# Patient Record
Sex: Male | Born: 1970 | Race: White | Hispanic: No | Marital: Married | Smoking: Never smoker
Health system: Southern US, Community
[De-identification: ages and names within clinical notes are randomized; demographics above are authoritative.]

## PROBLEM LIST (undated history)

## (undated) DIAGNOSIS — C801 Malignant (primary) neoplasm, unspecified: Secondary | ICD-10-CM

## (undated) HISTORY — PX: BACK SURGERY: SHX140

---
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
## 1997-12-09 DIAGNOSIS — C801 Malignant (primary) neoplasm, unspecified: Secondary | ICD-10-CM

## 1997-12-09 HISTORY — DX: Malignant (primary) neoplasm, unspecified: C80.1

## 2005-12-09 HISTORY — PX: CARPAL TUNNEL RELEASE: SHX101

## 2017-07-09 ENCOUNTER — Encounter
Admission: RE | Admit: 2017-07-09 | Discharge: 2017-07-09 | Disposition: A | Discharge status: Home / Self Care | Payer: BLUE CROSS/BLUE SHIELD | Source: Ambulatory Visit | Attending: Neurosurgery | Admitting: Neurosurgery

## 2017-07-09 ENCOUNTER — Ambulatory Visit
Admission: RE | Admit: 2017-07-09 | Discharge: 2017-07-09 | Disposition: A | Discharge status: Home / Self Care | Payer: BLUE CROSS/BLUE SHIELD | Source: Ambulatory Visit | Attending: Neurosurgery | Admitting: Neurosurgery

## 2017-07-09 DIAGNOSIS — Z01812 Encounter for preprocedural laboratory examination: Secondary | ICD-10-CM | POA: Insufficient documentation

## 2017-07-09 DIAGNOSIS — M5416 Radiculopathy, lumbar region: Secondary | ICD-10-CM | POA: Insufficient documentation

## 2017-07-09 DIAGNOSIS — Z01818 Encounter for other preprocedural examination: Secondary | ICD-10-CM | POA: Insufficient documentation

## 2017-07-09 DIAGNOSIS — Z01810 Encounter for preprocedural cardiovascular examination: Secondary | ICD-10-CM | POA: Diagnosis present

## 2017-07-09 HISTORY — DX: Malignant (primary) neoplasm, unspecified: C80.1

## 2017-07-09 LAB — CBC
HCT: 43 % (ref 40.0–52.0)
HEMOGLOBIN: 15 g/dL (ref 13.0–18.0)
MCH: 30.8 pg (ref 26.0–34.0)
MCHC: 34.9 g/dL (ref 32.0–36.0)
MCV: 88.1 fL (ref 80.0–100.0)
Platelets: 254 10*3/uL (ref 150–440)
RBC: 4.87 MIL/uL (ref 4.40–5.90)
RDW: 13 % (ref 11.5–14.5)
WBC: 5 10*3/uL (ref 3.8–10.6)

## 2017-07-09 LAB — TYPE AND SCREEN
ABO/RH(D): B POS
ANTIBODY SCREEN: NEGATIVE

## 2017-07-09 LAB — BASIC METABOLIC PANEL
ANION GAP: 8 (ref 5–15)
BUN: 22 mg/dL — ABNORMAL HIGH (ref 6–20)
CALCIUM: 9.5 mg/dL (ref 8.9–10.3)
CO2: 27 mmol/L (ref 22–32)
CREATININE: 0.9 mg/dL (ref 0.61–1.24)
Chloride: 106 mmol/L (ref 101–111)
GFR calc non Af Amer: >60 mL/min (ref >60)
Glucose, Bld: 94 mg/dL (ref 65–99)
Potassium: 4.1 mmol/L (ref 3.5–5.1)
SODIUM: 141 mmol/L (ref 135–145)

## 2017-07-09 LAB — SURGICAL PCR SCREEN
MRSA, PCR: NEGATIVE
STAPHYLOCOCCUS AUREUS: NEGATIVE

## 2017-07-09 LAB — URINALYSIS, ROUTINE W REFLEX MICROSCOPIC
Bilirubin Urine: NEGATIVE
GLUCOSE, UA: NEGATIVE mg/dL
Hgb urine dipstick: NEGATIVE
KETONES UR: NEGATIVE mg/dL
LEUKOCYTES UA: NEGATIVE
Nitrite: NEGATIVE
PROTEIN: NEGATIVE mg/dL
Specific Gravity, Urine: 1.02 (ref 1.005–1.030)
pH: 6 (ref 5.0–8.0)

## 2017-07-09 LAB — PROTIME-INR
INR: 0.92
Prothrombin Time: 12.4 seconds (ref 11.4–15.2)

## 2017-07-09 LAB — APTT: APTT: 28 s (ref 24–36)

## 2017-07-09 NOTE — Patient Instructions (Signed)
  Your procedure is scheduled on:Wednesday August 8 , 2018. Report to Same Day Surgery. To find out your arrival time please call [phone] between 1PM - 3PM on Tuesday July 15, 2017 .  Remember: Instructions that are not followed completely may result in serious medical risk, up to and including death, or upon the discretion of your surgeon and anesthesiologist your surgery may need to be rescheduled.    _x___ 1. Do not eat food or drink liquids after midnight. No gum chewing or hard candies.     __x__ 2. No Alcohol for 24 hours before or after surgery.   ____ 3. Bring all medications with you on the day of surgery if instructed.    __x__ 4. Notify your doctor if there is any change in your medical condition     (cold, fever, infections).    _____ 5. No smoking 24 hours prior to surgery.     Do not wear jewelry, make-up, hairpins, clips or nail polish.  Do not wear lotions, powders, or perfumes.   Do not shave 48 hours prior to surgery. Men may shave face and neck.  Do not bring valuables to the hospital.    [facility] is not responsible for any belongings or valuables.               Contacts, dentures or bridgework may not be worn into surgery.  Leave your suitcase in the car. After surgery it may be brought to your room.  For patients admitted to the hospital, discharge time is determined by your treatment team.   Patients discharged the day of surgery will not be allowed to drive home.    Please read over the following fact sheets that you were given:   [facility] Preparing for Surgery  ____ Take these medicines the morning of surgery with A SIP OF WATER: NONE     ____ Fleet Enema (as directed)   __x__ Use CHG Soap as directed on instruction sheet  ____ Use inhalers on the day of surgery and bring to hospital day of surgery  ____ Stop metformin 2 days prior to surgery    ____ Take 1/2 of usual insulin dose the night before surgery and none on the morning of  surgery.   __x__ Stopped aspirin already.  ____ Stop Anti-inflammatories such as Advil, Aleve, Ibuprofen, Motrin, Naproxen,  Naprosyn, Goodies powders or aspirin  products. OK to take Tylenol.   __x__ Stop supplements:Ascorbic Acid (VITAMIN C)  until after surgery.    ____ Bring C-Pap to the hospital.

## 2017-07-23 ENCOUNTER — Encounter
Admission: RE | Disposition: A | Discharge status: Home / Self Care | Payer: Self-pay | Source: Ambulatory Visit | Attending: Neurosurgery

## 2017-07-23 ENCOUNTER — Ambulatory Visit: Payer: BLUE CROSS/BLUE SHIELD

## 2017-07-23 ENCOUNTER — Ambulatory Visit
Admission: RE | Admit: 2017-07-23 | Discharge: 2017-07-23 | Disposition: A | Discharge status: Home / Self Care | Payer: BLUE CROSS/BLUE SHIELD | Source: Ambulatory Visit | Attending: Neurosurgery | Admitting: Neurosurgery

## 2017-07-23 ENCOUNTER — Encounter: Payer: Self-pay | Admitting: *Deleted

## 2017-07-23 ENCOUNTER — Ambulatory Visit: Payer: BLUE CROSS/BLUE SHIELD | Admitting: Certified Registered"

## 2017-07-23 DIAGNOSIS — M4807 Spinal stenosis, lumbosacral region: Secondary | ICD-10-CM | POA: Diagnosis not present

## 2017-07-23 DIAGNOSIS — Z79899 Other long term (current) drug therapy: Secondary | ICD-10-CM | POA: Diagnosis not present

## 2017-07-23 DIAGNOSIS — M48062 Spinal stenosis, lumbar region with neurogenic claudication: Secondary | ICD-10-CM | POA: Diagnosis present

## 2017-07-23 DIAGNOSIS — Z791 Long term (current) use of non-steroidal anti-inflammatories (NSAID): Secondary | ICD-10-CM | POA: Insufficient documentation

## 2017-07-23 DIAGNOSIS — Z7982 Long term (current) use of aspirin: Secondary | ICD-10-CM | POA: Insufficient documentation

## 2017-07-23 DIAGNOSIS — Z419 Encounter for procedure for purposes other than remedying health state, unspecified: Secondary | ICD-10-CM

## 2017-07-23 DIAGNOSIS — M5416 Radiculopathy, lumbar region: Secondary | ICD-10-CM | POA: Insufficient documentation

## 2017-07-23 HISTORY — PX: LUMBAR LAMINECTOMY/DECOMPRESSION MICRODISCECTOMY: SHX5026

## 2017-07-23 LAB — ABO/RH: ABO/RH(D): B POS

## 2017-07-23 SURGERY — LUMBAR LAMINECTOMY/DECOMPRESSION MICRODISCECTOMY 2 LEVELS
Anesthesia: General | Site: Spine Lumbar | Wound class: Clean

## 2017-07-23 MED ORDER — CEFAZOLIN SODIUM-DEXTROSE 2-4 GM/100ML-% IV SOLN
2.0000 g | INTRAVENOUS | Status: AC
  Administered 2017-07-23: 2 g via INTRAVENOUS

## 2017-07-23 MED ORDER — PHENYLEPHRINE HCL 10 MG/ML IJ SOLN
INTRAMUSCULAR | Status: AC
  Filled 2017-07-23: qty 1

## 2017-07-23 MED ORDER — MIDAZOLAM HCL 2 MG/2ML IJ SOLN
INTRAMUSCULAR | Status: AC
  Filled 2017-07-23: qty 2

## 2017-07-23 MED ORDER — THROMBIN 5000 UNITS EX SOLR
CUTANEOUS | Status: AC
Start: 2017-07-23 — End: ?
  Filled 2017-07-23: qty 5000

## 2017-07-23 MED ORDER — ROCURONIUM BROMIDE 100 MG/10ML IV SOLN
INTRAVENOUS | Status: DC | PRN
  Administered 2017-07-23: 50 mg via INTRAVENOUS

## 2017-07-23 MED ORDER — OXYCODONE HCL 5 MG PO TABS: 5.0000 mg | ORAL_TABLET | ORAL | 0 refills | Status: AC | PRN

## 2017-07-23 MED ORDER — CELECOXIB 200 MG PO CAPS: 200.0000 mg | ORAL_CAPSULE | Freq: Every day | ORAL | 0 refills | Status: AC

## 2017-07-23 MED ORDER — PROPOFOL 10 MG/ML IV BOLUS
INTRAVENOUS | Status: DC | PRN
Start: 2017-07-23 — End: 2017-07-23
  Administered 2017-07-23: 180 mg via INTRAVENOUS

## 2017-07-23 MED ORDER — DEXAMETHASONE SODIUM PHOSPHATE 10 MG/ML IJ SOLN
INTRAMUSCULAR | Status: AC
  Filled 2017-07-23: qty 1

## 2017-07-23 MED ORDER — BUPIVACAINE LIPOSOME 1.3 % IJ SUSP
INTRAMUSCULAR | Status: AC
  Filled 2017-07-23: qty 20

## 2017-07-23 MED ORDER — METHOCARBAMOL 500 MG PO TABS: 500.0000 mg | ORAL_TABLET | Freq: Four times a day (QID) | ORAL | 0 refills | Status: AC | PRN

## 2017-07-23 MED ORDER — DEXAMETHASONE SODIUM PHOSPHATE 10 MG/ML IJ SOLN
INTRAMUSCULAR | Status: DC | PRN
  Administered 2017-07-23: 10 mg via INTRAVENOUS

## 2017-07-23 MED ORDER — BUPIVACAINE-EPINEPHRINE (PF) 0.5% -1:200000 IJ SOLN
INTRAMUSCULAR | Status: AC
  Filled 2017-07-23: qty 30

## 2017-07-23 MED ORDER — REMIFENTANIL HCL 1 MG IV SOLR
INTRAVENOUS | Status: AC
  Filled 2017-07-23: qty 1000

## 2017-07-23 MED ORDER — PROMETHAZINE HCL 25 MG/ML IJ SOLN
12.5000 mg | Freq: Once | INTRAMUSCULAR | Status: AC
  Administered 2017-07-23: 12.5 mg via INTRAVENOUS

## 2017-07-23 MED ORDER — FENTANYL CITRATE (PF) 100 MCG/2ML IJ SOLN
INTRAMUSCULAR | Status: DC | PRN
Start: 2017-07-23 — End: 2017-07-23
  Administered 2017-07-23: 100 ug via INTRAVENOUS

## 2017-07-23 MED ORDER — SODIUM CHLORIDE FLUSH 0.9 % IV SOLN
INTRAVENOUS | Status: AC
Start: 2017-07-23 — End: ?
  Filled 2017-07-23: qty 10

## 2017-07-23 MED ORDER — ONDANSETRON HCL 4 MG/2ML IJ SOLN
4.0000 mg | Freq: Once | INTRAMUSCULAR | Status: AC | PRN
  Administered 2017-07-23: 4 mg via INTRAVENOUS

## 2017-07-23 MED ORDER — GELATIN ABSORBABLE 100 CM EX MISC
CUTANEOUS | Status: AC
  Filled 2017-07-23: qty 1

## 2017-07-23 MED ORDER — METHYLPREDNISOLONE ACETATE 40 MG/ML IJ SUSP
INTRAMUSCULAR | Status: AC
  Filled 2017-07-23: qty 1

## 2017-07-23 MED ORDER — PROPOFOL 10 MG/ML IV BOLUS
INTRAVENOUS | Status: AC
  Filled 2017-07-23: qty 40

## 2017-07-23 MED ORDER — LACTATED RINGERS IV SOLN
INTRAVENOUS | Status: DC
  Administered 2017-07-23 (×2): via INTRAVENOUS

## 2017-07-23 MED ORDER — CEFAZOLIN SODIUM-DEXTROSE 2-4 GM/100ML-% IV SOLN
INTRAVENOUS | Status: AC
  Filled 2017-07-23: qty 100

## 2017-07-23 MED ORDER — EPHEDRINE SULFATE 50 MG/ML IJ SOLN
INTRAMUSCULAR | Status: DC | PRN
  Administered 2017-07-23: 10 mg via INTRAVENOUS

## 2017-07-23 MED ORDER — BUPIVACAINE-EPINEPHRINE (PF) 0.5% -1:200000 IJ SOLN
INTRAMUSCULAR | Status: DC | PRN
  Administered 2017-07-23: 10 mL

## 2017-07-23 MED ORDER — SODIUM CHLORIDE FLUSH 0.9 % IV SOLN
INTRAVENOUS | Status: AC
  Filled 2017-07-23: qty 10

## 2017-07-23 MED ORDER — ONDANSETRON HCL 4 MG/2ML IJ SOLN
INTRAMUSCULAR | Status: DC | PRN
  Administered 2017-07-23: 4 mg via INTRAVENOUS

## 2017-07-23 MED ORDER — FENTANYL CITRATE (PF) 100 MCG/2ML IJ SOLN
INTRAMUSCULAR | Status: AC
  Filled 2017-07-23: qty 2

## 2017-07-23 MED ORDER — METHYLPREDNISOLONE ACETATE 40 MG/ML IJ SUSP
INTRAMUSCULAR | Status: DC | PRN
  Administered 2017-07-23: 40 mg

## 2017-07-23 MED ORDER — EPHEDRINE SULFATE 50 MG/ML IJ SOLN
INTRAMUSCULAR | Status: AC
  Filled 2017-07-23: qty 1

## 2017-07-23 MED ORDER — LACTATED RINGERS IV SOLN
INTRAVENOUS | Status: DC | PRN
  Administered 2017-07-23: 08:00:00 via INTRAVENOUS

## 2017-07-23 MED ORDER — GELATIN ABSORBABLE 12-7 MM EX MISC
CUTANEOUS | Status: DC | PRN
  Administered 2017-07-23: 1

## 2017-07-23 MED ORDER — METHYLPREDNISOLONE 4 MG PO TBPK: ORAL_TABLET | ORAL | 0 refills | Status: AC

## 2017-07-23 MED ORDER — PROMETHAZINE HCL 25 MG/ML IJ SOLN
INTRAMUSCULAR | Status: AC
  Filled 2017-07-23: qty 1

## 2017-07-23 MED ORDER — BUPIVACAINE HCL (PF) 0.5 % IJ SOLN
INTRAMUSCULAR | Status: AC
  Filled 2017-07-23: qty 30

## 2017-07-23 MED ORDER — SUCCINYLCHOLINE CHLORIDE 20 MG/ML IJ SOLN
INTRAMUSCULAR | Status: AC
  Filled 2017-07-23: qty 1

## 2017-07-23 MED ORDER — FAMOTIDINE 20 MG PO TABS
20.0000 mg | ORAL_TABLET | Freq: Once | ORAL | Status: AC
  Administered 2017-07-23: 20 mg via ORAL

## 2017-07-23 MED ORDER — SODIUM CHLORIDE 0.9 % IR SOLN
Status: DC | PRN
  Administered 2017-07-23: 1000 mL

## 2017-07-23 MED ORDER — ONDANSETRON HCL 4 MG/2ML IJ SOLN
INTRAMUSCULAR | Status: AC
  Filled 2017-07-23: qty 2

## 2017-07-23 MED ORDER — REMIFENTANIL HCL 1 MG IV SOLR
INTRAVENOUS | Status: DC | PRN
  Administered 2017-07-23: .1 ug/kg/min via INTRAVENOUS

## 2017-07-23 MED ORDER — ROCURONIUM BROMIDE 50 MG/5ML IV SOLN
INTRAVENOUS | Status: AC
  Filled 2017-07-23: qty 1

## 2017-07-23 MED ORDER — SODIUM CHLORIDE 0.9 % IJ SOLN
INTRAMUSCULAR | Status: AC
  Filled 2017-07-23: qty 50

## 2017-07-23 MED ORDER — FAMOTIDINE 20 MG PO TABS
ORAL_TABLET | ORAL | Status: AC
  Administered 2017-07-23: 07:00:00
  Filled 2017-07-23: qty 1

## 2017-07-23 MED ORDER — SODIUM CHLORIDE 0.9 % IV SOLN
INTRAVENOUS | Status: DC | PRN
  Administered 2017-07-23: 40 mL

## 2017-07-23 MED ORDER — LIDOCAINE HCL (CARDIAC) 20 MG/ML IV SOLN
INTRAVENOUS | Status: DC | PRN
  Administered 2017-07-23: 60 mg via INTRAVENOUS

## 2017-07-23 MED ORDER — MIDAZOLAM HCL 2 MG/2ML IJ SOLN
INTRAMUSCULAR | Status: DC | PRN
  Administered 2017-07-23: 2 mg via INTRAVENOUS

## 2017-07-23 MED ORDER — PHENYLEPHRINE HCL 10 MG/ML IJ SOLN
INTRAMUSCULAR | Status: DC | PRN
  Administered 2017-07-23: 50 ug/min via INTRAVENOUS

## 2017-07-23 MED ORDER — BUPIVACAINE HCL (PF) 0.5 % IJ SOLN
INTRAMUSCULAR | Status: DC | PRN
  Administered 2017-07-23: 20 mL

## 2017-07-23 MED ORDER — FENTANYL CITRATE (PF) 100 MCG/2ML IJ SOLN: 25.0000 ug | INTRAMUSCULAR | Status: DC | PRN

## 2017-07-23 MED ORDER — BACITRACIN 50000 UNITS IM SOLR
INTRAMUSCULAR | Status: AC
  Filled 2017-07-23: qty 1

## 2017-07-23 MED ORDER — THROMBIN 5000 UNITS EX KIT
PACK | CUTANEOUS | Status: DC | PRN
Start: 2017-07-23 — End: 2017-07-23
  Administered 2017-07-23: 5000 [IU] via TOPICAL

## 2017-07-23 SURGICAL SUPPLY — 63 items
BLADE BOVIE TIP EXT 4 (BLADE) ×2 IMPLANT
BUR NEURO DRILL SOFT 3.0X3.8M (BURR) ×2 IMPLANT
CANISTER SUCT 1200ML W/VALVE (MISCELLANEOUS) ×4 IMPLANT
CHLORAPREP W/TINT 26ML (MISCELLANEOUS) ×4 IMPLANT
CNTNR SPEC 2.5X3XGRAD LEK (MISCELLANEOUS) ×1
CONT SPEC 4OZ STER OR WHT (MISCELLANEOUS) ×1
CONTAINER SPEC 2.5X3XGRAD LEK (MISCELLANEOUS) ×1 IMPLANT
COUNTER NEEDLE 20/40 LG (NEEDLE) ×2 IMPLANT
COVER LIGHT HANDLE STERIS (MISCELLANEOUS) ×4 IMPLANT
CUP MEDICINE 2OZ PLAST GRAD ST (MISCELLANEOUS) ×4 IMPLANT
DERMABOND ADVANCED (GAUZE/BANDAGES/DRESSINGS) ×1
DERMABOND ADVANCED .7 DNX12 (GAUZE/BANDAGES/DRESSINGS) ×1 IMPLANT
DRAPE C-ARM 42X72 X-RAY (DRAPES) ×4 IMPLANT
DRAPE LAPAROTOMY 100X77 ABD (DRAPES) ×2 IMPLANT
DRAPE MICROSCOPE SPINE 48X150 (DRAPES) ×2 IMPLANT
DRAPE POUCH INSTRU U-SHP 10X18 (DRAPES) ×2 IMPLANT
DRAPE SURG 17X11 SM STRL (DRAPES) ×8 IMPLANT
DRSG TEGADERM 4X4.75 (GAUZE/BANDAGES/DRESSINGS) ×2 IMPLANT
DRSG TELFA 4X3 1S NADH ST (GAUZE/BANDAGES/DRESSINGS) IMPLANT
DURASEAL APPLICATOR TIP (TIP) IMPLANT
DURASEAL SPINE SEALANT 3ML (MISCELLANEOUS) IMPLANT
ELECT CAUTERY BLADE TIP 2.5 (TIP) ×2
ELECT EZSTD 165MM 6.5IN (MISCELLANEOUS) ×2
ELECTRODE CAUTERY BLDE TIP 2.5 (TIP) ×1 IMPLANT
ELECTRODE EZSTD 165MM 6.5IN (MISCELLANEOUS) ×1 IMPLANT
FRAME EYE SHIELD (PROTECTIVE WEAR) ×2 IMPLANT
GLOVE BIO SURGEON STRL SZ 6.5 (GLOVE) ×2 IMPLANT
GLOVE BIOGEL PI IND STRL 7.0 (GLOVE) ×1 IMPLANT
GLOVE BIOGEL PI INDICATOR 7.0 (GLOVE) ×1
GLOVE SURG SYN 8.5  E (GLOVE) ×3
GLOVE SURG SYN 8.5 E (GLOVE) ×3 IMPLANT
GOWN SRG XL LVL 3 NONREINFORCE (GOWNS) ×1 IMPLANT
GOWN STRL NON-REIN TWL XL LVL3 (GOWNS) ×1
GOWN STRL REUS W/ TWL LRG LVL3 (GOWN DISPOSABLE) ×1 IMPLANT
GOWN STRL REUS W/TWL LRG LVL3 (GOWN DISPOSABLE) ×1
GRADUATE 1200CC STRL 31836 (MISCELLANEOUS) ×2 IMPLANT
IV CATH ANGIO 12GX3 LT BLUE (NEEDLE) ×2 IMPLANT
KIT SPINAL PRONEVIEW (KITS) ×2 IMPLANT
KNIFE BAYONET SHORT DISCETOMY (MISCELLANEOUS) IMPLANT
MARKER SKIN DUAL TIP RULER LAB (MISCELLANEOUS) ×6 IMPLANT
NDL SAFETY ECLIPSE 18X1.5 (NEEDLE) ×1 IMPLANT
NEEDLE HYPO 18GX1.5 SHARP (NEEDLE) ×1
NEEDLE HYPO 22GX1.5 SAFETY (NEEDLE) ×2 IMPLANT
NS IRRIG 1000ML POUR BTL (IV SOLUTION) ×2 IMPLANT
PACK LAMINECTOMY NEURO (CUSTOM PROCEDURE TRAY) ×2 IMPLANT
PAD ARMBOARD 7.5X6 YLW CONV (MISCELLANEOUS) ×2 IMPLANT
PATTIES SURGICAL .5X1.5 (GAUZE/BANDAGES/DRESSINGS) IMPLANT
SPOGE SURGIFLO 8M (HEMOSTASIS) ×2
SPONGE SURGIFLO 8M (HEMOSTASIS) ×2 IMPLANT
STAPLER SKIN PROX 35W (STAPLE) IMPLANT
SUT DVC VLOC 3-0 CL 6 P-12 (SUTURE) ×2 IMPLANT
SUT NURALON 4 0 TR CR/8 (SUTURE) IMPLANT
SUT VIC AB 0 CT1 27 (SUTURE) ×1
SUT VIC AB 0 CT1 27XCR 8 STRN (SUTURE) ×1 IMPLANT
SUT VIC AB 2-0 CT1 18 (SUTURE) ×4 IMPLANT
SUT VICRYL 0 UR6 27IN ABS (SUTURE) IMPLANT
SYR 20CC LL (SYRINGE) ×2 IMPLANT
SYR 30ML LL (SYRINGE) ×4 IMPLANT
SYR 3ML LL SCALE MARK (SYRINGE) ×2 IMPLANT
SYRINGE 10CC LL (SYRINGE) ×2 IMPLANT
TOWEL OR 17X26 4PK STRL BLUE (TOWEL DISPOSABLE) ×8 IMPLANT
TUBE METRX 18MMX5CM (INSTRUMENTS) ×2 IMPLANT
TUBING CONNECTING 10 (TUBING) ×2 IMPLANT

## 2017-07-23 NOTE — Anesthesia Preprocedure Evaluation (Signed)
Anesthesia Evaluation  Patient identified by MRN, date of birth, ID band Patient awake    Reviewed: Allergy & Precautions, NPO status , Patient's Chart, lab work & pertinent test results  Airway Mallampati: II       Dental  (+) Teeth Intact   Pulmonary neg pulmonary ROS,    breath sounds clear to auscultation       Cardiovascular Exercise Tolerance: Good  Rhythm:Regular     Neuro/Psych negative neurological ROS  negative psych ROS   GI/Hepatic negative GI ROS, Neg liver ROS,   Endo/Other  negative endocrine ROS  Renal/GU negative Renal ROS     Musculoskeletal   Abdominal Normal abdominal exam  (+)   Peds negative pediatric ROS (+)  Hematology   Anesthesia Other Findings   Reproductive/Obstetrics                             Anesthesia Physical Anesthesia Plan  ASA: I  Anesthesia Plan: General   Post-op Pain Management:    Induction: Intravenous  PONV Risk Score and Plan: 0  Airway Management Planned: Oral ETT  Additional Equipment:   Intra-op Plan:   Post-operative Plan: Extubation in OR  Informed Consent: I have reviewed the patients History and Physical, chart, labs and discussed the procedure including the risks, benefits and alternatives for the proposed anesthesia with the patient or authorized representative who has indicated his/her understanding and acceptance.     Plan Discussed with: CRNA  Anesthesia Plan Comments:         Anesthesia Quick Evaluation

## 2017-07-23 NOTE — Transfer of Care (Signed)
Immediate Anesthesia Transfer of Care Note  Patient: Thomas Potter  Procedure(s) Performed: Procedure(s): LUMBAR LAMINECTOMY/DECOMPRESSION MICRODISCECTOMY 2 LEVELS L4-S1 (N/A)  Patient Location: PACU  Anesthesia Type:General  Level of Consciousness: awake, alert , oriented and patient cooperative  Airway & Oxygen Therapy: Patient Spontanous Breathing and Patient connected to face mask oxygen  Post-op Assessment: Report given to RN, Post -op Vital signs reviewed and stable and Patient moving all extremities X 4  Post vital signs: Reviewed and stable  Last Vitals:  Vitals:   07/23/17 0618 07/23/17 1001  BP: (!) 128/93 134/84  Pulse: 72 86  Resp: 16 10  Temp: 36.5 C (!) 36.3 C  SpO2: 100% 100%    Last Pain:  Vitals:   07/23/17 0618  TempSrc: Oral  PainSc: 2          Complications: No apparent anesthesia complications

## 2017-07-23 NOTE — Progress Notes (Signed)
Dr. Rhea Bleacher PA at bedside talking with patient.

## 2017-07-23 NOTE — Progress Notes (Signed)
Patient states he feels nauseated, patient began To gag and did throw up.  Dr. Izetta Dakin called and medicine Ordered for nausea and vomiting.

## 2017-07-23 NOTE — Discharge Summary (Signed)
  History: Thomas Potter is here for lumbar stenosis with neurogenic claudication s/p lumbar laminectomy/decompression microdiscectomy 2 level L4-S1. Patient tolerated the procedure well. Complains of mild nausea when raising head in PACU but improving with time and medication. Requests prescription for celebrex. Unable to assess improvement of symptoms prior to surgery since he hasn't gotten up yet. Denies current pain in legs or back.   Physical Exam: Vitals:   07/23/17 1100 07/23/17 1115  BP: 104/70 114/67  Pulse: 68 73  Resp: 13 16  Temp:    SpO2: 100% 100%    AA Ox3 CNI Strength:5/5 throughout, sensation intact and symmetric.   Data:  No results for input(s): NA, K, CL, CO2, BUN, CREATININE, LABGLOM, GLUCOSE, CALCIUM in the last 168 hours. No results for input(s): AST, ALT, ALKPHOS in the last 168 hours.  Invalid input(s): TBILI   No results for input(s): WBC, HGB, HCT, PLT in the last 168 hours. No results for input(s): APTT, INR in the last 168 hours.      Assessment/Plan:  Thomas Potter tolerated the procedure well. Currently denies any pain, but is having mild nausea that is being controlled with medication. Requests celebrex for pain. Will follow up in 2 weeks at the clinic.   Marin Olp PA-C Department of Neurosurgery

## 2017-07-23 NOTE — H&P (Signed)
History of Present Illness: I have confirmed the details below.   From initial visit on 05/20/2017 Thomas Potter is a 46 y.o. male who presents with the chief complaint of left leg discomfort. He lifted something in Fall 2017 and began having L leg pain. Prior to his other surgeries, he has had RLE pain (mostly resolved after his last surgery). Had a baby in December 2017, exacerbated discomfort in LLE after bouncing his baby. He has variable discomfort in his left leg. He currently has pins and needles in the bottom of his left foot, that sometimes also involves the back of his leg. He also sometimes feels like sunburn. His discomfort now is primarily left leg. He has had some RLE symptoms as well, but the left is worse. He describes pain, heaviness, discomfort when walking or standing, relieved by laying down.  He sometimes has pain at the superomedial aspect his left gluteus maximus. Walking worsens his symptoms, yoga, exercise, physical therapy has helped to some degree.  Weakness: some with walking Timing: evening Bowel/Bladder Dysfunction: none  Presently: Patient present to review EMG and flex/ext xray results.   Conservative measures:  Physical therapy: sees PT in ongoing manner. Is doing some ongoing exercises and some nontraditional techniques  Multimodal medical therapy including regular antiinflammatories: celebrex - helped, neurontin - didn't help and made sleepy. Turmeric helped Injections: Last epidural steroid injections in 2018 - didn't help  Past Surgery: L4-5 discectomy - improved. L5-S1 decompression - improved with RLE pain  [name] has no symptoms of cervical myelopathy.  The symptoms are causing a significant impact on the patient's life.   Review of Systems:  A 10 point review of systems is negative, except for the pertinent positives and negatives detailed in the HPI.  Past Medical History: Past Medical History:  Diagnosis Date  . Cancer (CMS-HCC)   testical   Past Surgical History: Past Surgical History:  Procedure Laterality Date  . SPINE SURGERY 2006  L5-S1 lam  . SPINE SURGERY 2017  decompression   Allergies: Allergies as of 06/12/2017  . (No Known Allergies)   Medications: Outpatient Encounter Prescriptions as of 06/12/2017  Medication Sig Dispense Refill  . ascorbic acid, vitamin C, (VITAMIN C) 1000 MG tablet Take 1,000 mg by mouth once daily.  Marland Kitchen aspirin 81 MG EC tablet Take 81 mg by mouth once daily.  . B-complex with vitamin C (VITAMIN B COMPLEX-C ORAL) Take 1 capsule by mouth once daily.  . celecoxib (CELEBREX) 100 MG capsule Take 100 mg by mouth once daily.  . cholecalciferol (CHOLECALCIFEROL) 1,000 unit tablet Take 1,000 Units by mouth.  . gabapentin (NEURONTIN) 300 MG capsule Take 300 mg by mouth nightly.  Marland Kitchen MIRTAZAPINE ORAL Take 3.75 mg by mouth once daily as needed.  . TURMERIC ROOT EXTRACT ORAL Take 800 mg by mouth 2 (two) times daily as needed.   No facility-administered encounter medications on file as of 06/12/2017.   Social History: Social History  Substance Use Topics  . Smoking status: Never Smoker  . Smokeless tobacco: Never Used  . Alcohol use Yes   Family Medical History: History reviewed. No pertinent family history.  Physical Examination: Vitals:  06/12/17 0932  BP: 121/88  Pulse: 69  Weight: 85.6 kg (188 lb 12.8 oz)  Height: 182.9 cm (6')  PainSc: 2  PainLoc: Leg   General: Patient is well developed, well nourished, calm, collected, and in no apparent distress. Attention to examination is appropriate.  Psychiatric: Patient is non-anxious.  Head: Pupils  equal, round, and reactive to light.  ENT: Oral mucosa appears well hydrated.  Neck: Supple. Full range of motion.  Respiratory: Patient is breathing without any difficulty.  Extremities: No edema.  Vascular: Palpable dorsal pedal pulses.  Skin: On exposed skin, there are no abnormal skin lesions.  Heart sounds normal no  MRG. Chest Clear to Auscultation Bilaterally.  NEUROLOGICAL:  General: In no acute distress.  Awake, alert, oriented to person, place, and time. Speech is clear and fluent. Fund of knowledge is appropriate.   Cranial Nerves: Pupils equal round and reactive to light. Facial tone is symmetric. Facial sensation is symmetric. Shoulder shrug is symmetric. Tongue protrusion is midline. There is no pronator drift.  ROM of spine: full. Palpation of spine: non tender.   Strength: Side Biceps Triceps Deltoid Interossei Grip Wrist Ext. Wrist Flex.  R 5 5 5 5 5 5 5   L 5 5 5 5 5 5 5    Side Iliopsoas Quads Hamstring PF DF EHL  R 5 5 5 5 5 5   L 5 5 5 5 5 5    Reflexes are 1+ and symmetric at the biceps, triceps, brachioradialis, patella and achilles. Bilateral upper and lower extremity sensation is intact to light touch. Clonus is not present. Toes are down-going. Gait is normal. No difficulty with tandem gait. Hoffman's is absent.  Rapid alternating movements are normal.   Medical Decision Making  Imaging: MRI of the lumbar spine shows disc dessication at L4-5 and loss of height at that level. He has left greater than right lateral recess stenosis. At L5-S1, he has bilateral lateral recess stenosis.   He has a prominent S1-2 disc.  I have personally reviewed the images and agree with the above interpretation.  EMG: 06/06/2017 There is evidence of acute on chronic left lower lumbar radiculopathy most consistent with the left S1 level.  Flex/Et xrays: 06/12/2017  Assessment and Plan: [name] is a pleasant 46 y.o. male with neurogenic claudication and acute on chronic S1 radiculopathy on the left.  - OR for L4-S1 decompression

## 2017-07-23 NOTE — Op Note (Signed)
Indications: Mr. Villalpando has recurrent lumbar stenosis at L4-5 and L5-S1 with failure of conservative management after finding neurogenic claudication and lumbar radiculopathy  Findings: stenosis at L4-5  Preoperative Diagnosis: Lumbar Stenosis with neurogenic claudication Postoperative Diagnosis: same   EBL: 25 ml IVF: 600 ml Drains: none Disposition: Extubated and Stable to PACU Complications: none  No foley catheter was placed.   Preoperative Note:   Risks of surgery discussed include: infection, bleeding, stroke, coma, death, paralysis, CSF leak, nerve/spinal cord injury, numbness, tingling, weakness, complex regional pain syndrome, recurrent stenosis and/or disc herniation, vascular injury, development of instability, neck/back pain, need for further surgery, persistent symptoms, development of deformity, and the risks of anesthesia. They understood these risks and have agreed to proceed.  Operative Note:   1. L4-S1 lumbar decompression including central laminectomy and bilateral medial facetectomies including foraminotomies  The patient was then brought from the preoperative center with intravenous access established.  The patient underwent general anesthesia and endotracheal tube intubation, and was then rotated on the [place] rail top where all pressure points were appropriately padded.  The skin was then thoroughly cleansed.  Perioperative antibiotic prophylaxis was administered.  Sterile prep and drapes were then applied and a timeout was then observed.  C-arm was brought into the field under sterile conditions and under lateral visualization the L4-5 interspace was identified and marked.  The incision was marked on the left and injected with local anesthetic. Once this was complete a 2 cm incision was opened with the use of a #10 blade knife.  The metrx tubes were sequentially advanced and confirmed in position. An 30mm by 21mm tube was locked in place to the bed side attachment.   Fluoroscopy was then removed from the field.  The microscope was then sterilely brought into the field and muscle creep was hemostased with a bipolar and resected with a pituitary rongeur.  A Bovie extender was then used to expose the spinous process and lamina.  Careful attention was placed to not violate the facet capsule. A 3 mm matchstick drill bit was then used to make a hemi-laminotomy trough until the ligamentum flavum was exposed.  This was extended to the base of the spinous process and to the contralateral side to remove all the central bone from each side.  Once this was complete and the underlying ligamentum flavum was visualized, it was dissected with a curette and resected with Kerrison rongeurs.  There was extensive scar formation from the prior surgery, and there was no visible ligamentum flavum on the right at L4-5.  The dura was identified and palpated. The kerrison rongeur was then used to remove the medial facet bilaterally until no compression was noted.  A balltip probe was used to confirm decompression of the left L5 nerve root.  Additional attention was paid to completion of the contralateral L4-5 foraminotomy until the right L5 nerve root was completely free.  Once this was complete, L4-5 central decompression including medial facetectomy and foraminotomy was confirmed and decompression on both sides was confirmed. No CSF leak was noted.  A Depo-Medrol soaked Gelfoam pledget was placed in the defect.  The wound was copiously irrigated. The tube system was then removed under microscopic visualization and hemostasis was obtained with a bipolar.    The metrx tubes were sequentially advanced then to L5-S1 and confirmed in position. An 54mm by 77mm tube was locked in place to the bed side attachment.  Fluoroscopy was then removed from the field.  The muscle creep was  hemostased with a bipolar and resected with a pituitary rongeur.  A Bovie extender was then used to expose the spinous  process and lamina.  Careful attention was placed to not violate the facet capsule. A 3 mm matchstick drill bit was then used to make a hemi-laminotomy trough until the ligamentum flavum was exposed on the left.  The ligamentum had been removed from the right side and to the midline. The laminotomy was extended to the base of the spinous process and to the contralateral side to remove all the central bone from each side.  Once this was complete and the underlying ligamentum flavum was visualized, it was dissected with a curette and resected with Kerrison rongeurs.  There was extensive scar formation from the prior surgery, and there was no visible ligamentum flavum on the right at L5-S1.  The dura was identified and palpated. The kerrison rongeur was then used to remove the medial facet bilaterally until no compression was noted.  A balltip probe was used to confirm decompression of the left S1 nerve root.  Additional attention was paid to completion of the contralateral L5-S1 foraminotomy until the right S1 nerve root was completely free.  Once this was complete, L5-S1 central decompression including medial facetectomy and foraminotomy was confirmed and decompression on both sides was confirmed. No CSF leak was noted.  A Depo-Medrol soaked Gelfoam pledget was placed in the defect.  The wound was copiously irrigated. The tube system was then removed under microscopic visualization and hemostasis was obtained with a bipolar.   The fascial layer was reapproximated with the use of a 0 Vicryl suture.  Subcutaneous tissue layer was reapproximated using 2-0 Vicryl suture.  3-0 monocryl was placed in subcuticular fashion. The skin was then cleansed and Dermabond was used to close the skin opening.  Patient was then rotated back to the preoperative bed awakened from anesthesia and taken to recovery all counts are correct in this case.  I performed the entire procedure with the assistance of Marin Olp PA as an  Pensions consultant.  Linda Biehn K. Izora Ribas MD

## 2017-07-23 NOTE — Discharge Instructions (Addendum)
°Your surgeon has performed an operation on your lumbar spine (low back) to relieve pressure on one or more nerves. Many times, patients feel better immediately after surgery and can “overdo it.” Even if you feel well, it is important that you follow these activity guidelines. If you do not let your back heal properly from the surgery, you can increase the chance of a disc herniation and/or return of your symptoms. The following are instructions to help in your recovery once you have been discharged from the hospital. ° °* Do not take anti-inflammatory medications for 3 days after surgery (naproxen [Aleve], ibuprofen [Advil, Motrin], celecoxib [Celebrex], etc.) ° °Activity  °  °No bending, lifting, or twisting (“BLT”). Avoid lifting objects heavier than 10 pounds (gallon milk jug).  Where possible, avoid household activities that involve lifting, bending, pushing, or pulling such as laundry, vacuuming, grocery shopping, and childcare. Try to arrange for help from friends and family for these activities while your back heals. ° °Increase physical activity slowly as tolerated.  Taking short walks is encouraged, but avoid strenuous exercise. Do not jog, run, bicycle, lift weights, or participate in any other exercises unless specifically allowed by your doctor. Avoid prolonged sitting, including car rides. ° °Talk to your doctor before resuming sexual activity. ° °You should not drive until cleared by your doctor. ° °Until released by your doctor, you should not return to work or school.  You should rest at home and let your body heal.  ° °You may shower two days after your surgery.  After showering, lightly dab your incision dry. Do not take a tub bath or go swimming for 3 weeks, or until approved by your doctor at your follow-up appointment. ° °If you smoke, we strongly recommend that you quit.  Smoking has been proven to interfere with normal healing in your back and will dramatically reduce the success rate of  your surgery. Please contact [facility] (800-QUIT-NOW) and use the resources at www.[facility].com for assistance in stopping smoking. ° °Surgical Incision °  °If you have a dressing on your incision, you may remove it three days after your surgery. Keep your incision area clean and dry. ° °If you have staples or stitches on your incision, you should have a follow up scheduled for removal. If you do not have staples or stitches, you will have steri-strips (small pieces of surgical tape) or Dermabond glue. The steri-strips/glue should begin to peel away within about a week (it is fine if the steri-strips fall off before then). If the strips are still in place one week after your surgery, you may gently remove them. ° °Diet          ° ° You may return to your usual diet. Be sure to stay hydrated. ° °When to Contact Us ° °Although your surgery and recovery will likely be uneventful, you may have some residual numbness, aches, and pains in your back and/or legs. This is normal and should improve in the next few weeks. ° °However, should you experience any of the following, contact us immediately: °• New numbness or weakness °• Pain that is progressively getting worse, and is not relieved by your pain medications or rest °• Bleeding, redness, swelling, pain, or drainage from surgical incision °• Chills or flu-like symptoms °• Fever greater than 101.0 F (38.3 C) °• Problems with bowel or bladder functions °• Difficulty breathing or shortness of breath °• Warmth, tenderness, or swelling in your calf ° °Contact Information °• During office hours (Monday-Friday   9 am to 5 pm), please call your physician at [phone]  After hours and weekends, please call the [place] Operator at [phone] and ask for the Neurosurgery Resident On Call   For a life-threatening emergency, call [facility]   1) The drugs that you were given will stay in your system until tomorrow so for the next  24 hours you should not:  A) Drive an automobile B) Make any legal decisions C) Drink any alcoholic beverage   2) You may resume regular meals tomorrow.  Today it is better to start with liquids and gradually work up to solid foods.  You may eat anything you prefer, but it is better to start with liquids, then soup and crackers, and gradually work up to solid foods.   3) Please notify your doctor immediately if you have any unusual bleeding, trouble breathing, redness and pain at the surgery site, drainage, fever, or pain not relieved by medication.    4) Additional Instructions:drink plenty of fluids today, but keep it light.  Change your position slowly so that your head can adjust to the movement.     Please contact your physician with any problems or Same Day Surgery at [phone], Monday through Friday 6 am to 4 pm, or [place] [facility] number at [phone].

## 2017-07-23 NOTE — Progress Notes (Signed)
Patient able to move all extremities wnl, neuro intact. Patient denies any pain.

## 2017-07-23 NOTE — Anesthesia Postprocedure Evaluation (Signed)
Anesthesia Post Note  Patient: Thomas Potter  Procedure(s) Performed: Procedure(s) (LRB): LUMBAR LAMINECTOMY/DECOMPRESSION MICRODISCECTOMY 2 LEVELS L4-S1 (N/A)  Patient location during evaluation: PACU Anesthesia Type: General Level of consciousness: awake Pain management: pain level controlled Vital Signs Assessment: post-procedure vital signs reviewed and stable Respiratory status: spontaneous breathing Cardiovascular status: stable Anesthetic complications: no     Last Vitals:  Vitals:   07/23/17 1030 07/23/17 1045  BP:  116/75  Pulse: 73 80  Resp: 16 20  Temp:    SpO2: 100% 100%    Last Pain:  Vitals:   07/23/17 0618  TempSrc: Oral  PainSc: 2                  VAN STAVEREN,Angelino Rumery

## 2017-07-23 NOTE — Anesthesia Post-op Follow-up Note (Signed)
Anesthesia QCD[facility] form completed.        

## 2017-07-23 NOTE — Anesthesia Procedure Notes (Signed)
Procedure Name: Intubation Date/Time: 07/23/2017 7:27 AM Performed by: Silvana Newness Pre-anesthesia Checklist: Patient identified, Emergency Drugs available, Suction available, Patient being monitored and Timeout performed Patient Re-evaluated:Patient Re-evaluated prior to induction Oxygen Delivery Method: Circle system utilized Preoxygenation: Pre-oxygenation with 100% oxygen Induction Type: IV induction Ventilation: Mask ventilation without difficulty Laryngoscope Size: Mac and 4 Grade View: Grade I Tube type: Oral Tube size: 7.5 mm Number of attempts: 1 Airway Equipment and Method: Stylet Placement Confirmation: ETT inserted through vocal cords under direct vision,  positive ETCO2 and breath sounds checked- equal and bilateral Secured at: 21 cm Tube secured with: Tape Dental Injury: Teeth and Oropharynx as per pre-operative assessment

## 2017-07-25 MED FILL — Thrombin For Soln 5000 Unit: CUTANEOUS | Qty: 5000 | Status: AC

## 2018-12-06 IMAGING — CR DG CHEST 2V
1 series · 2 of 2 positions shown · non-contrast
Comparison: None.

CLINICAL DATA: Lumbar laminectomy. Preoperative examination.
History of testicular cancer.

EXAM:
CHEST  2 VIEW

[Series 1: dg chest 2 view · 0.14mm/px · 2 of 2 slices shown]
[im 1/2]
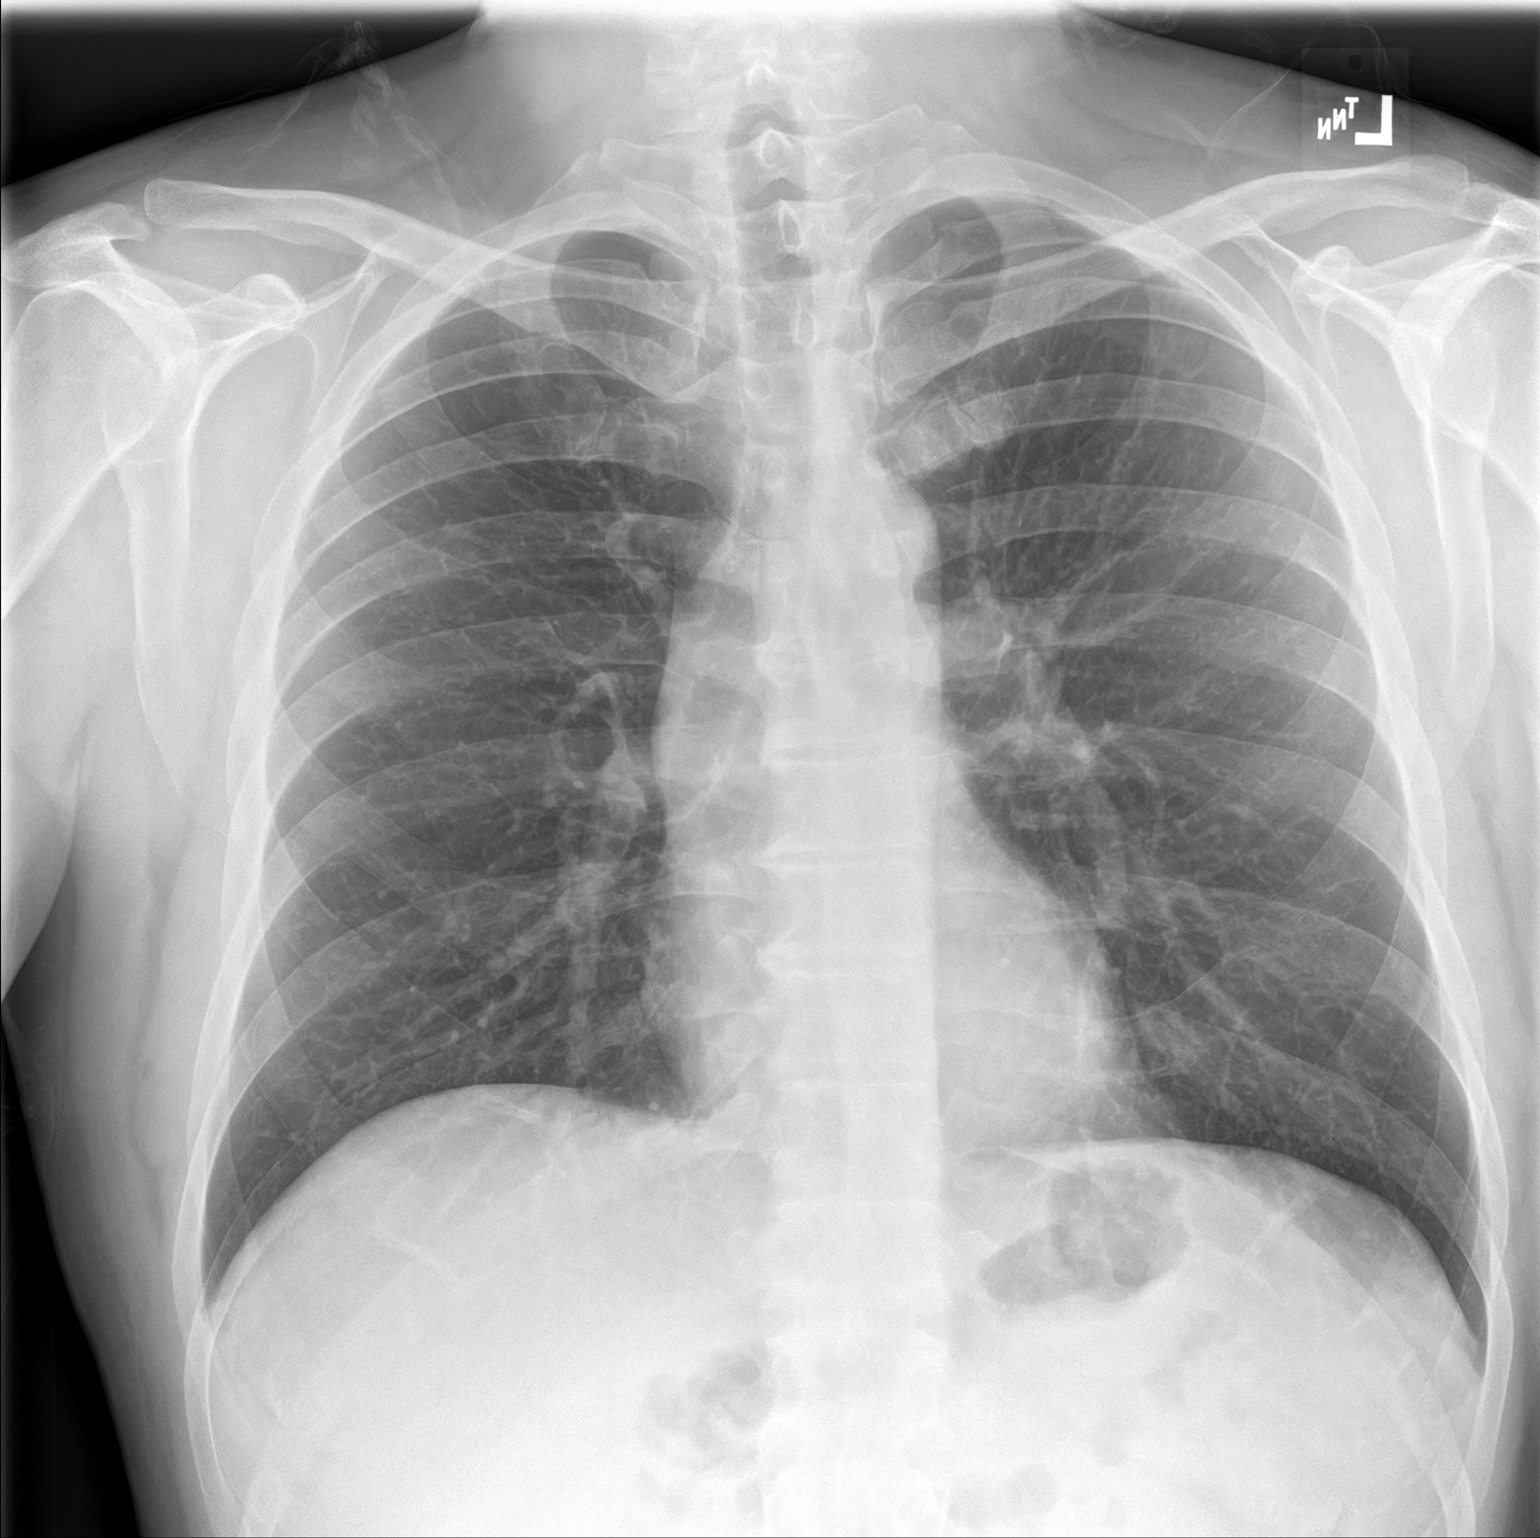
[im 2/2]
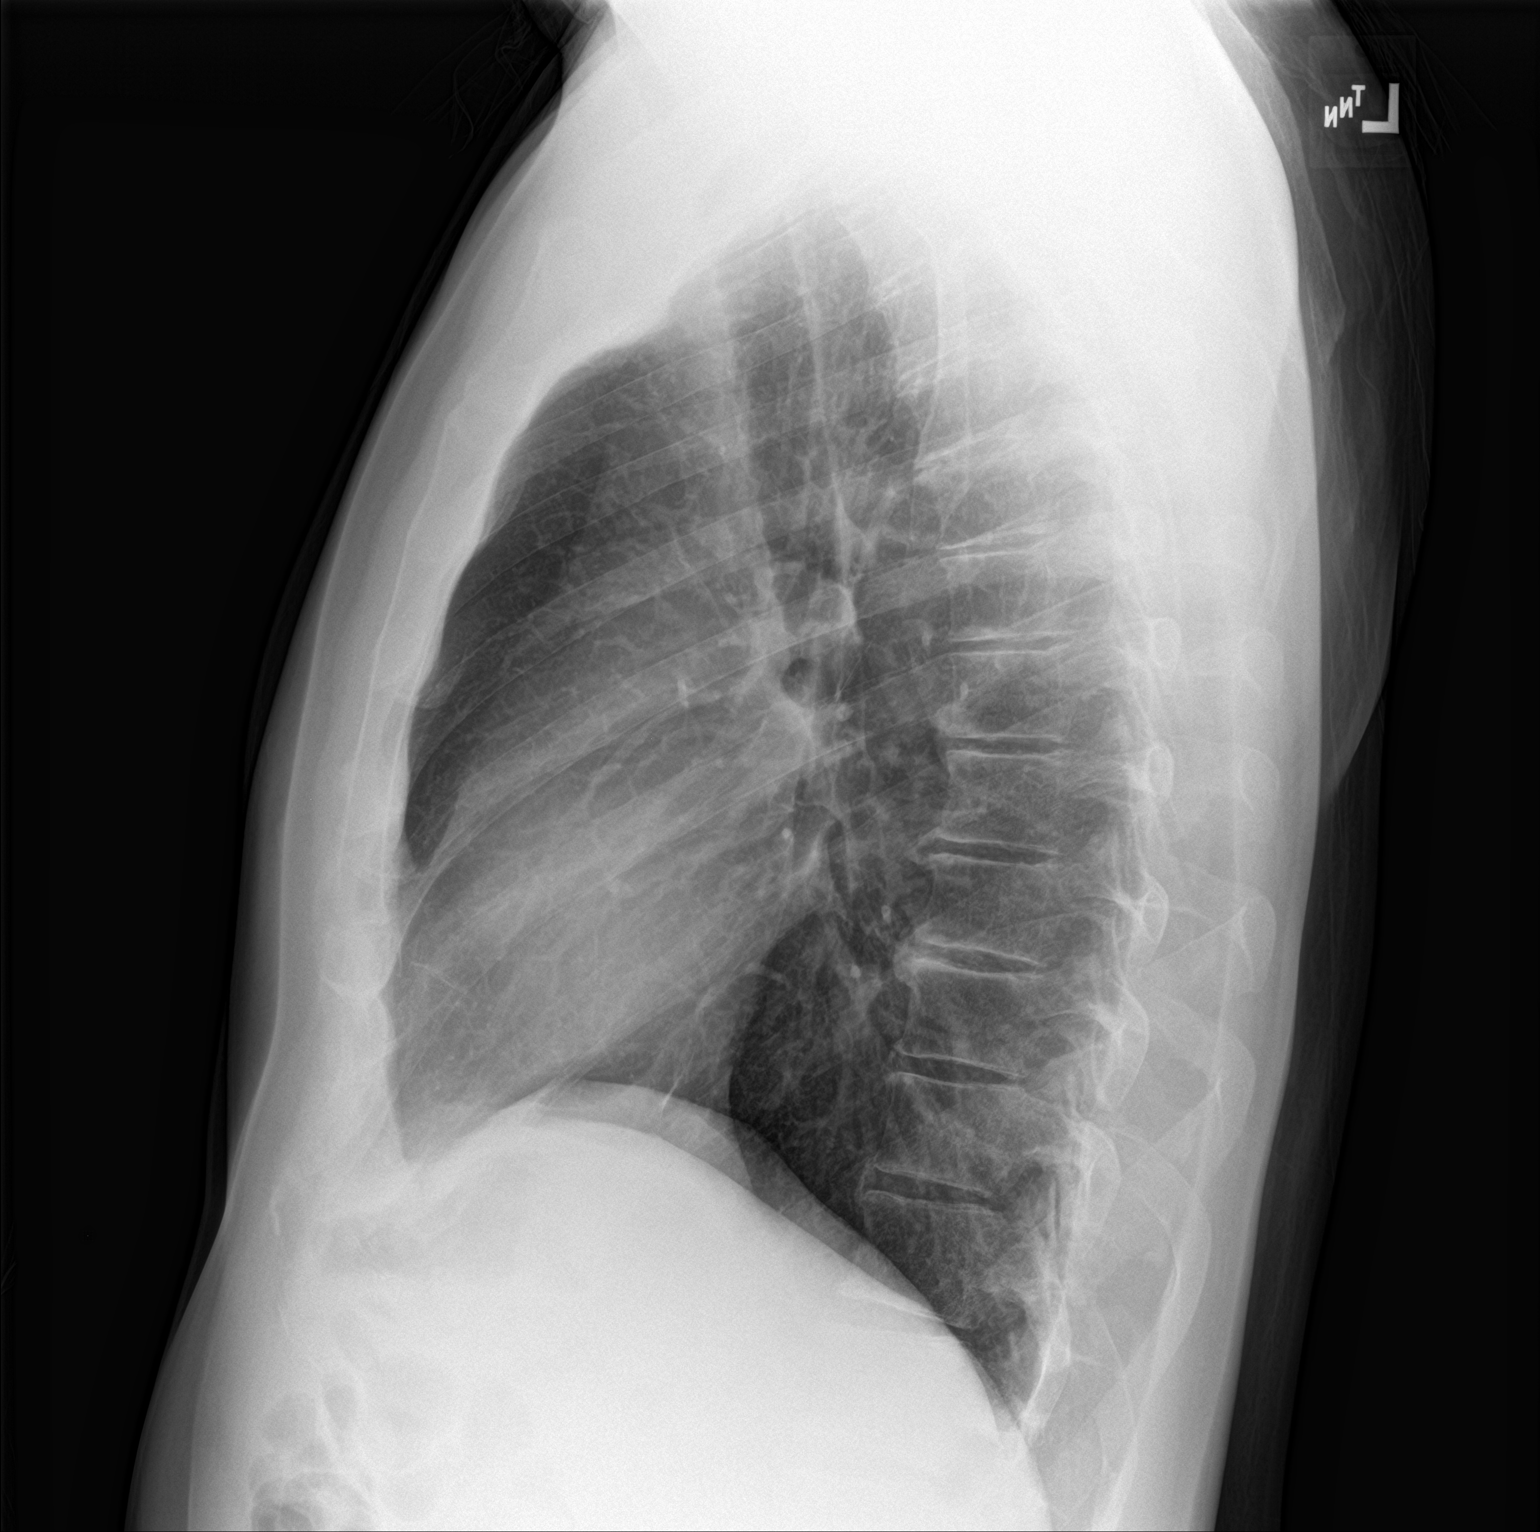

[2 of 2 positions shown; findings below may reference images not displayed]

FINDINGS: Normal cardiac silhouette and mediastinal contours. No focal
parenchymal opacities. No pleural effusion or pneumothorax. No
evidence of edema. No acute osseus abnormalities. Stigmata DISH
within the midthoracic spine.
IMPRESSION: No acute cardiopulmonary disease.
